# Patient Record
Sex: Female | Born: 1960 | Race: Black or African American | Hispanic: No | Marital: Single | Smoking: Never smoker
Health system: Southern US, Community
[De-identification: ages and names within clinical notes are randomized; demographics above are authoritative.]

## PROBLEM LIST (undated history)

## (undated) DIAGNOSIS — Z8679 Personal history of other diseases of the circulatory system: Secondary | ICD-10-CM

## (undated) DIAGNOSIS — I10 Essential (primary) hypertension: Secondary | ICD-10-CM

## (undated) DIAGNOSIS — K642 Third degree hemorrhoids: Secondary | ICD-10-CM

## (undated) DIAGNOSIS — K219 Gastro-esophageal reflux disease without esophagitis: Secondary | ICD-10-CM

## (undated) DIAGNOSIS — Z973 Presence of spectacles and contact lenses: Secondary | ICD-10-CM

---

## 2005-04-09 HISTORY — PX: TOTAL ABDOMINAL HYSTERECTOMY W/ BILATERAL SALPINGOOPHORECTOMY: SHX83

## 2015-12-27 ENCOUNTER — Ambulatory Visit: Payer: Self-pay | Admitting: Surgery

## 2015-12-27 NOTE — H&P (Signed)
Christine Jones 12/27/2015 11:05 AM Location: [facility] Patient #: [id number] DOB: 1960-05-19 Divorced / Language: [place] / Race: Refused to Report/Unreported Female   History of Present Illness Christine Sportsman MD; 12/27/2015 12:13 PM) The patient is a 55 year old female who presents with hemorrhoids. Note for "Hemorrhoids": Woman referred my partner Dr. Derrell Lolling for concern of persistent hemorrhoid issues.  55 year old woman with chronic hemorrhoid issues. Seen by our group 3 months ago. She's had a couple bandings done. Was started on Miralax. There is discussion about considering surgery. Has some external hemorrhoids that are concerning. Both agreed she was exhausting nonsurgical options. Therefore surgery recommended. However think the patient changed her mind given costs of surgery. She wished to get a second opinion to see if that her options before proceeding with surgery. Therefore sent to me for evaluation.  No personal nor family history of GI/colon cancer, inflammatory bowel disease, irritable bowel syndrome, allergy such as Celiac Sprue, dietary/dairy problems, colitis, ulcers nor gastritis. No recent sick contacts/gastroenteritis. No travel outside the country. No changes in diet. No dysphagia to solids or liquids. No significant heartburn or reflux. No hematochezia, hematemesis, coffee ground emesis. No evidence of prior gastric/peptic ulceration. She apparently had a colonoscopy urinating nearby [facility] gastroenterology. She recalls being told it was completely normal aside from the hemorrhoids.   Allergies (Sonya Bynum, CMA; 12/27/2015 11:06 AM) No Known Drug Allergies06/22/2017  Medication History (Sonya Bynum, CMA; 12/27/2015 11:07 AM) Anusol-HC (25MG  Suppository, 1 (one) suppository Rectal two times daily, Taken starting 10/24/2015) Active. Telmisartan-Amlodipine (40-5MG  Tablet, Oral) Active. Medications Reconciled  Vitals (Sonya Bynum CMA;  12/27/2015 11:06 AM) 12/27/2015 11:06 AM Weight: 240 lb Height: 66in Body Surface Area: 2.16 m Body Mass Index: 38.74 kg/m  Temp.: 69F(Temporal)  Pulse: 77 (Regular)  BP: 130/76 (Sitting, Left Arm, Standard)       Physical Exam Christine Sportsman MD; 12/27/2015 12:12 PM) General Mental Status-Alert. General Appearance-Not in acute distress, Not Sickly. Orientation-Oriented X3. Hydration-Well hydrated. Voice-Normal.  Integumentary Global Assessment Upon inspection and palpation of skin surfaces of the - Axillae: non-tender, no inflammation or ulceration, no drainage. and Distribution of scalp and body hair is normal. General Characteristics Temperature - normal warmth is noted.  Head and Neck Head-normocephalic, atraumatic with no lesions or palpable masses. Face Global Assessment - atraumatic, no absence of expression. Neck Global Assessment - no abnormal movements, no bruit auscultated on the right, no bruit auscultated on the left, no decreased range of motion, non-tender. Trachea-midline. Thyroid Gland Characteristics - non-tender.  Eye Eyeball - Left-Extraocular movements intact, No Nystagmus. Eyeball - Right-Extraocular movements intact, No Nystagmus. Cornea - Left-No Hazy. Cornea - Right-No Hazy. Sclera/Conjunctiva - Left-No scleral icterus, No Discharge. Sclera/Conjunctiva - Right-No scleral icterus, No Discharge. Pupil - Left-Direct reaction to light normal. Pupil - Right-Direct reaction to light normal.  ENMT Ears Pinna - Left - no drainage observed, no generalized tenderness observed. Right - no drainage observed, no generalized tenderness observed. Nose and Sinuses External Inspection of the Nose - no destructive lesion observed. Inspection of the nares - Left - quiet respiration. Right - quiet respiration. Mouth and Throat Lips - Upper Lip - no fissures observed, no pallor noted. Lower Lip - no fissures observed,  no pallor noted. Nasopharynx - no discharge present. Oral Cavity/Oropharynx - Tongue - no dryness observed. Oral Mucosa - no cyanosis observed. Hypopharynx - no evidence of airway distress observed.  Chest and Lung Exam Inspection Movements - Normal and Symmetrical. Accessory muscles - No  use of accessory muscles in breathing. Palpation Palpation of the chest reveals - Non-tender. Auscultation Breath sounds - Normal and Clear.  Cardiovascular Auscultation Rhythm - Regular. Murmurs & Other Heart Sounds - Auscultation of the heart reveals - No Murmurs and No Systolic Clicks.  Abdomen Inspection Inspection of the abdomen reveals - No Visible peristalsis and No Abnormal pulsations. Umbilicus - No Bleeding, No Urine drainage. Palpation/Percussion Palpation and Percussion of the abdomen reveal - Soft, Non Tender, No Rebound tenderness, No Rigidity (guarding) and No Cutaneous hyperesthesia. Note: Abdomen soft. Nontender, nondistended. No guarding. No umbilical no other hernias   Female Genitourinary Sexual Maturity Tanner 5 - Adult hair pattern. Note: No vaginal bleeding nor discharge. No inguinal hernias.   Rectal Note: 3 piles internal hemorrhoids with partial prolapse and some external tag component as well.  Exam done with assistance of female PA-S in the room. Perianal skin clean with good hygiene. No pruritis ani. No pilonidal disease. No fissure. No abscess/fistula. Upper range of normal sphincter tone. No stricture. Very sensitive in anal canal on digital exam but no fissure felt. No warts. No obvious abscess.. Held off on anoscopy.   Peripheral Vascular Upper Extremity Inspection - Left - No Cyanotic nailbeds, Not Ischemic. Right - No Cyanotic nailbeds, Not Ischemic.  Neurologic Neurologic evaluation reveals -normal attention span and ability to concentrate, able to name objects and repeat phrases. Appropriate fund of knowledge , normal sensation and normal  coordination. Mental Status Affect - not angry, not paranoid. Cranial Nerves-Normal Bilaterally. Gait-Normal.  Neuropsychiatric Mental status exam performed with findings of-able to articulate well with normal speech/language, rate, volume and coherence, thought content normal with ability to perform basic computations and apply abstract reasoning and no evidence of hallucinations, delusions, obsessions or homicidal/suicidal ideation.  Musculoskeletal Global Assessment Spine, Ribs and Pelvis - no instability, subluxation or laxity. Right Upper Extremity - no instability, subluxation or laxity.  Lymphatic Head & Neck  General Head & Neck Lymphatics: Bilateral - Description - No Localized lymphadenopathy. Axillary  General Axillary Region: Bilateral - Description - No Localized lymphadenopathy. Femoral & Inguinal  Generalized Femoral & Inguinal Lymphatics: Left - Description - No Localized lymphadenopathy. Right - Description - No Localized lymphadenopathy.    Assessment & Plan Christine Jones(Dario Yono C. Chue Berkovich MD; 12/27/2015 12:14 PM) PROLAPSED INTERNAL HEMORRHOIDS, GRADE 3 (K64.2) Impression: 3 piles of internal hemorrhoids that prolapse rather easily with some external component.  Despite a fiber bowel regimen with alternating MiraLAX and Colace, she still struggles. She's had 2 banding stone and she still struggles.  I think this requires an operation. [facility]able [facility] and [facility] and then excise any persistent external tissue. However there are no other really good options. She is ready proceed.  I'll see if we can give her some better pain control regimens. I would like to avoid narcotics. Avoid per rectal suppositories and just do outside topical therapy. Add naproxen for an anti-inflammatory.  The anatomy & physiology of the anorectal region was discussed. The pathophysiology of hemorrhoids and differential diagnosis was discussed.  Natural history progression was discussed. I stressed the importance of a bowel regimen to have daily soft bowel movements to minimize progression of disease. Goal of one BM / day ideal. Use of wet wipes, warm baths, avoiding straining, etc were emphasized.  Educational handouts further explaining the pathology, treatment options, and bowel regimen were given as well. The patient expressed understanding. Current Plans Pt Education - [facility] Hemorrhoids (Yatziry Deakins): discussed  with patient and provided information. You are being scheduled for surgery - Our schedulers will call you.  You should hear from our office's scheduling department within 5 working days about the location, date, and time of surgery. We try to make accommodations for patient's preferences in scheduling surgery, but sometimes the OR schedule or the surgeon's schedule prevents [place] from making those accommodations.  If you have not heard from our office [phone]) in 5 working days, call the office and ask for your surgeon's nurse.  If you have other questions about your diagnosis, plan, or surgery, call the office and ask for your surgeon's nurse.  The anatomy & physiology of the anorectal region was discussed. The pathophysiology of hemorrhoids and differential diagnosis was discussed. Natural history risks without surgery was discussed. I stressed the importance of a bowel regimen to have daily soft bowel movements to minimize progression of disease. Interventions such as sclerotherapy & banding were discussed.  The patient's symptoms are not adequately controlled by medicines and other non-operative treatments. I feel the risks & problems of no surgery outweigh the operative risks; therefore, I recommended surgery to treat the hemorrhoids by ligation, pexy, and possible resection.  Risks such as bleeding, infection, urinary difficulties, need for further treatment, heart attack, death, and other risks were discussed. I noted a good  likelihood this will help address the problem. Goals of post-operative recovery were discussed as well. Possibility that this will not correct all symptoms was explained. Post-operative pain, bleeding, constipation, and other problems after surgery were discussed. We will work to minimize complications. Educational handouts further explaining the pathology, treatment options, and bowel regimen were given as well. Questions were answered. The patient expresses understanding & wishes to proceed with surgery.  Pt Education - [facility] Rectal Prep for Anorectal outpatient/office surgery: discussed with patient and provided information. Pt Education - [facility] Rectal Surgery HCI (Nalaya Wojdyla): discussed with patient and provided information. Started Naproxen 500MG , 1 (one) Tablet two times daily, #30, 12/27/2015, Ref. x1. Started Anusol-HC 2.5%, 1 (one) Application three times daily, as needed, 1 Tube, 12/27/2015, Ref. x3. EXTERNAL HEMORRHOIDS WITH COMPLICATION (K64.4)    Signed by Christine Sportsman, MD (12/27/2015 12:15 PM)

## 2016-02-13 ENCOUNTER — Encounter (HOSPITAL_BASED_OUTPATIENT_CLINIC_OR_DEPARTMENT_OTHER): Payer: Self-pay | Admitting: *Deleted

## 2016-02-13 NOTE — Progress Notes (Signed)
NPO AFTER MN.  ARRIVE AT 0900.  NEEDS ISTAT AND EKG.  WILL TAKE ZANTAC AM DOS W/ SIPS OF WATER.  PT VERBALIZED WAS GIVEN RECTAL PREP INSTRUCTIONS AND UNDERSTOOD THEM.  WILL DO HIBICLENS HS BEFORE AND AM DOS.

## 2016-02-16 ENCOUNTER — Encounter (HOSPITAL_BASED_OUTPATIENT_CLINIC_OR_DEPARTMENT_OTHER)
Admission: RE | Disposition: A | Discharge status: Home / Self Care | Payer: Self-pay | Source: Ambulatory Visit | Attending: Surgery

## 2016-02-16 ENCOUNTER — Ambulatory Visit (HOSPITAL_BASED_OUTPATIENT_CLINIC_OR_DEPARTMENT_OTHER)
Admission: RE | Admit: 2016-02-16 | Discharge: 2016-02-16 | Disposition: A | Discharge status: Home / Self Care | Payer: BLUE CROSS/BLUE SHIELD | Source: Ambulatory Visit | Attending: Surgery | Admitting: Surgery

## 2016-02-16 ENCOUNTER — Encounter (HOSPITAL_BASED_OUTPATIENT_CLINIC_OR_DEPARTMENT_OTHER): Payer: Self-pay

## 2016-02-16 ENCOUNTER — Ambulatory Visit (HOSPITAL_BASED_OUTPATIENT_CLINIC_OR_DEPARTMENT_OTHER): Payer: BLUE CROSS/BLUE SHIELD | Admitting: Anesthesiology

## 2016-02-16 ENCOUNTER — Other Ambulatory Visit: Payer: Self-pay

## 2016-02-16 DIAGNOSIS — Z6839 Body mass index (BMI) 39.0-39.9, adult: Secondary | ICD-10-CM | POA: Insufficient documentation

## 2016-02-16 DIAGNOSIS — K644 Residual hemorrhoidal skin tags: Secondary | ICD-10-CM | POA: Diagnosis not present

## 2016-02-16 DIAGNOSIS — I10 Essential (primary) hypertension: Secondary | ICD-10-CM | POA: Diagnosis not present

## 2016-02-16 DIAGNOSIS — K219 Gastro-esophageal reflux disease without esophagitis: Secondary | ICD-10-CM | POA: Diagnosis not present

## 2016-02-16 DIAGNOSIS — K643 Fourth degree hemorrhoids: Secondary | ICD-10-CM | POA: Diagnosis present

## 2016-02-16 DIAGNOSIS — Z79899 Other long term (current) drug therapy: Secondary | ICD-10-CM | POA: Diagnosis not present

## 2016-02-16 DIAGNOSIS — K642 Third degree hemorrhoids: Secondary | ICD-10-CM

## 2016-02-16 DIAGNOSIS — E669 Obesity, unspecified: Secondary | ICD-10-CM | POA: Diagnosis not present

## 2016-02-16 HISTORY — DX: Third degree hemorrhoids: K64.2

## 2016-02-16 HISTORY — DX: Essential (primary) hypertension: I10

## 2016-02-16 HISTORY — PX: HEMORRHOID SURGERY: SHX153

## 2016-02-16 HISTORY — DX: Personal history of other diseases of the circulatory system: Z86.79

## 2016-02-16 HISTORY — DX: Gastro-esophageal reflux disease without esophagitis: K21.9

## 2016-02-16 HISTORY — DX: Presence of spectacles and contact lenses: Z97.3

## 2016-02-16 LAB — POCT I-STAT, CHEM 8
BUN: 10 mg/dL (ref 6–20)
CALCIUM ION: 1.25 mmol/L (ref 1.15–1.40)
CREATININE: 1.1 mg/dL — AB (ref 0.44–1.00)
Chloride: 105 mmol/L (ref 101–111)
GLUCOSE: 95 mg/dL (ref 65–99)
HEMATOCRIT: 40 % (ref 36.0–46.0)
HEMOGLOBIN: 13.6 g/dL (ref 12.0–15.0)
Potassium: 3.8 mmol/L (ref 3.5–5.1)
Sodium: 140 mmol/L (ref 135–145)
TCO2: 27 mmol/L (ref 0–100)

## 2016-02-16 SURGERY — EXAM UNDER ANESTHESIA
Anesthesia: General

## 2016-02-16 MED ORDER — EPINEPHRINE PF 1 MG/ML IJ SOLN
INTRAMUSCULAR | Status: AC
  Filled 2016-02-16: qty 1

## 2016-02-16 MED ORDER — METRONIDAZOLE IN NACL 5-0.79 MG/ML-% IV SOLN
500.0000 mg | INTRAVENOUS | Status: AC
  Administered 2016-02-16: 500 mg via INTRAVENOUS
  Filled 2016-02-16: qty 100

## 2016-02-16 MED ORDER — GABAPENTIN 300 MG PO CAPS
300.0000 mg | ORAL_CAPSULE | ORAL | Status: AC
  Administered 2016-02-16: 300 mg via ORAL
  Filled 2016-02-16: qty 1

## 2016-02-16 MED ORDER — LIDOCAINE 2% (20 MG/ML) 5 ML SYRINGE
INTRAMUSCULAR | Status: AC
  Filled 2016-02-16: qty 10

## 2016-02-16 MED ORDER — SUCCINYLCHOLINE CHLORIDE 200 MG/10ML IV SOSY
PREFILLED_SYRINGE | INTRAVENOUS | Status: DC | PRN
  Administered 2016-02-16: 100 mg via INTRAVENOUS

## 2016-02-16 MED ORDER — CELECOXIB 200 MG PO CAPS
ORAL_CAPSULE | ORAL | Status: AC
  Filled 2016-02-16: qty 1

## 2016-02-16 MED ORDER — BUPIVACAINE HCL (PF) 0.25 % IJ SOLN
INTRAMUSCULAR | Status: AC
  Filled 2016-02-16: qty 30

## 2016-02-16 MED ORDER — BUPIVACAINE LIPOSOME 1.3 % IJ SUSP
INTRAMUSCULAR | Status: AC
  Filled 2016-02-16: qty 20

## 2016-02-16 MED ORDER — CHLORHEXIDINE GLUCONATE CLOTH 2 % EX PADS
6.0000 | MEDICATED_PAD | Freq: Once | CUTANEOUS | Status: DC
  Filled 2016-02-16: qty 6

## 2016-02-16 MED ORDER — FENTANYL CITRATE (PF) 100 MCG/2ML IJ SOLN
25.0000 ug | INTRAMUSCULAR | Status: DC | PRN
  Filled 2016-02-16: qty 1

## 2016-02-16 MED ORDER — ONDANSETRON HCL 4 MG/2ML IJ SOLN
INTRAMUSCULAR | Status: DC | PRN
  Administered 2016-02-16: 4 mg via INTRAVENOUS

## 2016-02-16 MED ORDER — BUPIVACAINE-EPINEPHRINE 0.25% -1:200000 IJ SOLN
INTRAMUSCULAR | Status: DC | PRN
  Administered 2016-02-16: 30 mL

## 2016-02-16 MED ORDER — DEXAMETHASONE SODIUM PHOSPHATE 4 MG/ML IJ SOLN
INTRAMUSCULAR | Status: DC | PRN
  Administered 2016-02-16: 10 mg via INTRAVENOUS

## 2016-02-16 MED ORDER — LACTATED RINGERS IV SOLN
INTRAVENOUS | Status: DC
  Administered 2016-02-16 (×2): via INTRAVENOUS
  Filled 2016-02-16: qty 1000

## 2016-02-16 MED ORDER — DIBUCAINE 1 % RE OINT
TOPICAL_OINTMENT | RECTAL | Status: DC | PRN
  Administered 2016-02-16: 1 via RECTAL

## 2016-02-16 MED ORDER — SUCCINYLCHOLINE CHLORIDE 20 MG/ML IJ SOLN: INTRAMUSCULAR | Status: DC | PRN

## 2016-02-16 MED ORDER — OXYCODONE HCL 5 MG PO TABS
5.0000 mg | ORAL_TABLET | Freq: Once | ORAL | Status: AC
  Administered 2016-02-16: 5 mg via ORAL
  Filled 2016-02-16: qty 1

## 2016-02-16 MED ORDER — EPHEDRINE SULFATE 50 MG/ML IJ SOLN
INTRAMUSCULAR | Status: DC | PRN
  Administered 2016-02-16: 5 mg via INTRAVENOUS
  Administered 2016-02-16: 10 mg via INTRAVENOUS

## 2016-02-16 MED ORDER — GABAPENTIN 300 MG PO CAPS
ORAL_CAPSULE | ORAL | Status: AC
  Filled 2016-02-16: qty 1

## 2016-02-16 MED ORDER — LIDOCAINE HCL (CARDIAC) 20 MG/ML IV SOLN
INTRAVENOUS | Status: DC | PRN
  Administered 2016-02-16: 40 mg via INTRAVENOUS

## 2016-02-16 MED ORDER — FENTANYL CITRATE (PF) 100 MCG/2ML IJ SOLN
INTRAMUSCULAR | Status: DC | PRN
  Administered 2016-02-16: 100 ug via INTRAVENOUS

## 2016-02-16 MED ORDER — CEFAZOLIN SODIUM-DEXTROSE 2-4 GM/100ML-% IV SOLN
INTRAVENOUS | Status: AC
  Filled 2016-02-16: qty 100

## 2016-02-16 MED ORDER — METRONIDAZOLE IN NACL 5-0.79 MG/ML-% IV SOLN
INTRAVENOUS | Status: AC
  Filled 2016-02-16: qty 100

## 2016-02-16 MED ORDER — PROMETHAZINE HCL 25 MG/ML IJ SOLN
6.2500 mg | INTRAMUSCULAR | Status: DC | PRN
  Filled 2016-02-16: qty 1

## 2016-02-16 MED ORDER — FENTANYL CITRATE (PF) 100 MCG/2ML IJ SOLN
INTRAMUSCULAR | Status: AC
  Filled 2016-02-16: qty 2

## 2016-02-16 MED ORDER — MIDAZOLAM HCL 2 MG/2ML IJ SOLN
INTRAMUSCULAR | Status: AC
  Filled 2016-02-16: qty 2

## 2016-02-16 MED ORDER — ARTIFICIAL TEARS OP OINT
TOPICAL_OINTMENT | OPHTHALMIC | Status: AC
  Filled 2016-02-16: qty 3.5

## 2016-02-16 MED ORDER — OXYCODONE HCL 5 MG PO TABS: 5.0000 mg | ORAL_TABLET | ORAL | 0 refills | Status: AC | PRN

## 2016-02-16 MED ORDER — CELECOXIB 400 MG PO CAPS
400.0000 mg | ORAL_CAPSULE | ORAL | Status: AC
  Administered 2016-02-16: 400 mg via ORAL
  Filled 2016-02-16: qty 1

## 2016-02-16 MED ORDER — CEFAZOLIN SODIUM-DEXTROSE 2-4 GM/100ML-% IV SOLN
2.0000 g | INTRAVENOUS | Status: AC
  Administered 2016-02-16: 2 g via INTRAVENOUS
  Filled 2016-02-16: qty 100

## 2016-02-16 MED ORDER — NAPROXEN 500 MG PO TABS: 500.0000 mg | ORAL_TABLET | Freq: Two times a day (BID) | ORAL | 1 refills | Status: AC

## 2016-02-16 MED ORDER — LIDOCAINE 2% (20 MG/ML) 5 ML SYRINGE
INTRAMUSCULAR | Status: DC | PRN
  Administered 2016-02-16: 50 mg via INTRAVENOUS

## 2016-02-16 MED ORDER — BUPIVACAINE LIPOSOME 1.3 % IJ SUSP
20.0000 mL | INTRAMUSCULAR | Status: DC
  Filled 2016-02-16: qty 20

## 2016-02-16 MED ORDER — ACETAMINOPHEN 500 MG PO TABS
ORAL_TABLET | ORAL | Status: AC
  Filled 2016-02-16: qty 2

## 2016-02-16 MED ORDER — PROPOFOL 10 MG/ML IV BOLUS
INTRAVENOUS | Status: DC | PRN
  Administered 2016-02-16: 150 mg via INTRAVENOUS

## 2016-02-16 MED ORDER — OXYCODONE HCL 5 MG PO TABS
ORAL_TABLET | ORAL | Status: AC
  Filled 2016-02-16: qty 1

## 2016-02-16 MED ORDER — ONDANSETRON HCL 4 MG/2ML IJ SOLN
INTRAMUSCULAR | Status: AC
  Filled 2016-02-16: qty 2

## 2016-02-16 MED ORDER — ACETAMINOPHEN 500 MG PO TABS
1000.0000 mg | ORAL_TABLET | ORAL | Status: AC
  Administered 2016-02-16: 1000 mg via ORAL
  Filled 2016-02-16: qty 2

## 2016-02-16 MED ORDER — CELECOXIB 200 MG PO CAPS
ORAL_CAPSULE | ORAL | Status: AC
  Filled 2016-02-16: qty 2

## 2016-02-16 MED ORDER — DIBUCAINE 1 % RE OINT
TOPICAL_OINTMENT | RECTAL | Status: AC
  Filled 2016-02-16: qty 28

## 2016-02-16 MED ORDER — PHENYLEPHRINE 40 MCG/ML (10ML) SYRINGE FOR IV PUSH (FOR BLOOD PRESSURE SUPPORT)
PREFILLED_SYRINGE | INTRAVENOUS | Status: AC
  Filled 2016-02-16: qty 10

## 2016-02-16 MED ORDER — BUPIVACAINE LIPOSOME 1.3 % IJ SUSP
INTRAMUSCULAR | Status: DC | PRN
Start: 2016-02-16 — End: 2016-02-16
  Administered 2016-02-16: 20 mL

## 2016-02-16 MED ORDER — SODIUM CHLORIDE 0.9 % IJ SOLN
INTRAMUSCULAR | Status: AC
  Filled 2016-02-16: qty 50

## 2016-02-16 MED FILL — NAPROXEN 500 MG TABLET: 500 | 15 days supply | Qty: 30 | Fill #0

## 2016-02-16 MED FILL — oxyCODONE HCL 5 MG TABS: 5 | 3 days supply | Qty: 40 | Fill #0

## 2016-02-16 SURGICAL SUPPLY — 60 items
BENZOIN TINCTURE PRP APPL 2/3 (GAUZE/BANDAGES/DRESSINGS) ×3 IMPLANT
BLADE CLIPPER SURG (BLADE) IMPLANT
BLADE HEX COATED 2.75 (ELECTRODE) IMPLANT
BLADE SURG 10 STRL SS (BLADE) IMPLANT
BLADE SURG 15 STRL LF DISP TIS (BLADE) ×1 IMPLANT
BLADE SURG 15 STRL SS (BLADE) ×2
BRIEF STRETCH FOR OB PAD LRG (UNDERPADS AND DIAPERS) ×3 IMPLANT
CANISTER SUCTION 1200CC (MISCELLANEOUS) ×3 IMPLANT
COVER BACK TABLE 60X90IN (DRAPES) ×3 IMPLANT
COVER MAYO STAND STRL (DRAPES) ×3 IMPLANT
DECANTER SPIKE VIAL GLASS SM (MISCELLANEOUS) IMPLANT
DRAPE LAPAROTOMY 100X72 PEDS (DRAPES) ×3 IMPLANT
DRAPE LG THREE QUARTER DISP (DRAPES) IMPLANT
DRSG PAD ABDOMINAL 8X10 ST (GAUZE/BANDAGES/DRESSINGS) IMPLANT
ELECT NEEDLE TIP 2.8 STRL (NEEDLE) IMPLANT
ELECT REM PT RETURN 9FT ADLT (ELECTROSURGICAL) ×3
ELECTRODE REM PT RTRN 9FT ADLT (ELECTROSURGICAL) ×1 IMPLANT
GLOVE BIOGEL PI IND STRL 8 (GLOVE) ×1 IMPLANT
GLOVE BIOGEL PI INDICATOR 8 (GLOVE) ×2
GLOVE ECLIPSE 8.0 STRL XLNG CF (GLOVE) ×3 IMPLANT
GLOVE INDICATOR 8.0 STRL GRN (GLOVE) ×3 IMPLANT
GOWN STRL REUS W/ TWL LRG LVL3 (GOWN DISPOSABLE) ×1 IMPLANT
GOWN STRL REUS W/ TWL XL LVL3 (GOWN DISPOSABLE) ×1 IMPLANT
GOWN STRL REUS W/TWL LRG LVL3 (GOWN DISPOSABLE) ×2
GOWN STRL REUS W/TWL XL LVL3 (GOWN DISPOSABLE) ×2
KIT ROOM TURNOVER WOR (KITS) ×3 IMPLANT
LEGGING LITHOTOMY PAIR STRL (DRAPES) IMPLANT
NDL SAFETY ECLIPSE 18X1.5 (NEEDLE) ×1 IMPLANT
NEEDLE HYPO 18GX1.5 SHARP (NEEDLE) ×2
NEEDLE HYPO 22GX1.5 SAFETY (NEEDLE) ×3 IMPLANT
NS IRRIG 500ML POUR BTL (IV SOLUTION) ×3 IMPLANT
PACK BASIN DAY SURGERY FS (CUSTOM PROCEDURE TRAY) ×3 IMPLANT
PAD ABD 8X10 STRL (GAUZE/BANDAGES/DRESSINGS) ×3 IMPLANT
PAD PREP 24X48 CUFFED NSTRL (MISCELLANEOUS) IMPLANT
PENCIL BUTTON HOLSTER BLD 10FT (ELECTRODE) ×3 IMPLANT
SHEARS HARMONIC 9CM CVD (BLADE) IMPLANT
SPONGE GAUZE 4X4 12PLY STER LF (GAUZE/BANDAGES/DRESSINGS) ×3 IMPLANT
SPONGE SURGIFOAM ABS GEL 100 (HEMOSTASIS) IMPLANT
SPONGE SURGIFOAM ABS GEL 12-7 (HEMOSTASIS) IMPLANT
SURGILUBE 2OZ TUBE FLIPTOP (MISCELLANEOUS) ×3 IMPLANT
SUT CHROMIC 2 0 SH (SUTURE) IMPLANT
SUT CHROMIC 3 0 SH 27 (SUTURE) ×3 IMPLANT
SUT VIC AB 2-0 SH 27 (SUTURE)
SUT VIC AB 2-0 SH 27X BRD (SUTURE) IMPLANT
SUT VIC AB 2-0 SH 27XBRD (SUTURE) IMPLANT
SUT VIC AB 2-0 UR6 27 (SUTURE) IMPLANT
SUT VICRYL 0 UR6 27IN ABS (SUTURE) ×18 IMPLANT
SUT VICRYL AB 2 0 TIE (SUTURE) IMPLANT
SUT VICRYL AB 2 0 TIES (SUTURE)
SYR 20CC LL (SYRINGE) ×3 IMPLANT
SYR BULB IRRIGATION 50ML (SYRINGE) ×3 IMPLANT
TAPE CLOTH 2X10 TAN LF (GAUZE/BANDAGES/DRESSINGS) IMPLANT
TAPE HYPAFIX 4 X10 (GAUZE/BANDAGES/DRESSINGS) IMPLANT
TOWEL OR 17X24 6PK STRL BLUE (TOWEL DISPOSABLE) ×6 IMPLANT
TRAY DSU PREP LF (CUSTOM PROCEDURE TRAY) ×3 IMPLANT
TUBE CONNECTING 12'X1/4 (SUCTIONS) ×1
TUBE CONNECTING 12X1/4 (SUCTIONS) ×2 IMPLANT
UNDERPAD 30X30 INCONTINENT (UNDERPADS AND DIAPERS) ×3 IMPLANT
WATER STERILE IRR 500ML POUR (IV SOLUTION) ×3 IMPLANT
YANKAUER SUCT BULB TIP NO VENT (SUCTIONS) ×3 IMPLANT

## 2016-02-16 NOTE — H&P (Signed)
Christine Jones  Location: [facility] Patient #: [id number] DOB: 30-Jan-1961 Divorced / Language: [place] / Race: Refused to Report/Unreported Female  Patient Care Team: Renaye Rakers, MD as PCP - General (Family Medicine) Karie Soda, MD as Consulting Physician (General Surgery)   History of Present Illness    Woman referred my partner Dr. Derrell Lolling for concern of persistent hemorrhoid issues.  55 year old woman with chronic hemorrhoid issues. Seen by our group 3 months ago. She's had a couple bandings done. Was started on Miralax. There is discussion about considering surgery. Has some external hemorrhoids that are concerning. Both agreed she was exhausting nonsurgical options. Therefore surgery recommended. However think the patient changed her mind given costs of surgery. She wished to get a second opinion to see if that her options before proceeding with surgery. Therefore sent to me for evaluation.  No personal nor family history of GI/colon cancer, inflammatory bowel disease, irritable bowel syndrome, allergy such as Celiac Sprue, dietary/dairy problems, colitis, ulcers nor gastritis. No recent sick contacts/gastroenteritis. No travel outside the country. No changes in diet. No dysphagia to solids or liquids. No significant heartburn or reflux. No hematochezia, hematemesis, coffee ground emesis. No evidence of prior gastric/peptic ulceration. She apparently had a colonoscopy urinating nearby [facility] gastroenterology. She recalls being told it was completely normal aside from the hemorrhoids.  No new events  Allergies (Christine Jones, CMA; 12/27/2015 11:06 AM) No Known Drug Allergies06/22/2017  Medication History (Christine Jones, CMA; 12/27/2015 11:07 AM) Anusol-HC (25MG  Suppository, 1 (one) suppository Rectal two times daily, Taken starting 10/24/2015) Active. Telmisartan-Amlodipine (40-5MG  Tablet, Oral) Active. Medications Reconciled  Vitals (Christine Jones CMA;  12/27/2015 11:06 AM) 12/27/2015 11:06 AM Weight: 240 lb Height: 66in Body Surface Area: 2.16 m Body Mass Index: 38.74 kg/m  Temp.: 61F(Temporal)  Pulse: 77 (Regular)  BP: 130/76 (Sitting, Left Arm, Standard)       Physical Exam Ardeth Sportsman MD; 12/27/2015 12:12 PM) General Mental Status-Alert. General Appearance-Not in acute distress, Not Sickly. Orientation-Oriented X3. Hydration-Well hydrated. Voice-Normal.  Integumentary Global Assessment Upon inspection and palpation of skin surfaces of the - Axillae: non-tender, no inflammation or ulceration, no drainage. and Distribution of scalp and body hair is normal. General Characteristics Temperature - normal warmth is noted.  Head and Neck Head-normocephalic, atraumatic with no lesions or palpable masses. Face Global Assessment - atraumatic, no absence of expression. Neck Global Assessment - no abnormal movements, no bruit auscultated on the right, no bruit auscultated on the left, no decreased range of motion, non-tender. Trachea-midline. Thyroid Gland Characteristics - non-tender.  Eye Eyeball - Left-Extraocular movements intact, No Nystagmus. Eyeball - Right-Extraocular movements intact, No Nystagmus. Cornea - Left-No Hazy. Cornea - Right-No Hazy. Sclera/Conjunctiva - Left-No scleral icterus, No Discharge. Sclera/Conjunctiva - Right-No scleral icterus, No Discharge. Pupil - Left-Direct reaction to light normal. Pupil - Right-Direct reaction to light normal.  ENMT Ears Pinna - Left - no drainage observed, no generalized tenderness observed. Right - no drainage observed, no generalized tenderness observed. Nose and Sinuses External Inspection of the Nose - no destructive lesion observed. Inspection of the nares - Left - quiet respiration. Right - quiet respiration. Mouth and Throat Lips - Upper Lip - no fissures observed, no pallor noted. Lower Lip - no fissures observed,  no pallor noted. Nasopharynx - no discharge present. Oral Cavity/Oropharynx - Tongue - no dryness observed. Oral Mucosa - no cyanosis observed. Hypopharynx - no evidence of airway distress observed.  Chest and Lung Exam Inspection Movements - Normal and Symmetrical. Accessory  muscles - No use of accessory muscles in breathing. Palpation Palpation of the chest reveals - Non-tender. Auscultation Breath sounds - Normal and Clear.  Cardiovascular Auscultation Rhythm - Regular. Murmurs & Other Heart Sounds - Auscultation of the heart reveals - No Murmurs and No Systolic Clicks.  Abdomen Inspection Inspection of the abdomen reveals - No Visible peristalsis and No Abnormal pulsations. Umbilicus - No Bleeding, No Urine drainage. Palpation/Percussion Palpation and Percussion of the abdomen reveal - Soft, Non Tender, No Rebound tenderness, No Rigidity (guarding) and No Cutaneous hyperesthesia. Note: Abdomen soft. Nontender, nondistended. No guarding. No umbilical no other hernias   Female Genitourinary Sexual Maturity Tanner 5 - Adult hair pattern. Note: No vaginal bleeding nor discharge. No inguinal hernias.   Rectal Note: 3 piles internal hemorrhoids with partial prolapse and some external tag component as well.  Exam done with assistance of female PA-S in the room. Perianal skin clean with good hygiene. No pruritis ani. No pilonidal disease. No fissure. No abscess/fistula. Upper range of normal sphincter tone. No stricture. Very sensitive in anal canal on digital exam but no fissure felt. No warts. No obvious abscess.. Held off on anoscopy.   Peripheral Vascular Upper Extremity Inspection - Left - No Cyanotic nailbeds, Not Ischemic. Right - No Cyanotic nailbeds, Not Ischemic.  Neurologic Neurologic evaluation reveals -normal attention span and ability to concentrate, able to name objects and repeat phrases. Appropriate fund of knowledge , normal sensation and normal  coordination. Mental Status Affect - not angry, not paranoid. Cranial Nerves-Normal Bilaterally. Gait-Normal.  Neuropsychiatric Mental status exam performed with findings of-able to articulate well with normal speech/language, rate, volume and coherence, thought content normal with ability to perform basic computations and apply abstract reasoning and no evidence of hallucinations, delusions, obsessions or homicidal/suicidal ideation.  Musculoskeletal Global Assessment Spine, Ribs and Pelvis - no instability, subluxation or laxity. Right Upper Extremity - no instability, subluxation or laxity.  Lymphatic Head & Neck  General Head & Neck Lymphatics: Bilateral - Description - No Localized lymphadenopathy. Axillary  General Axillary Region: Bilateral - Description - No Localized lymphadenopathy. Femoral & Inguinal  Generalized Femoral & Inguinal Lymphatics: Left - Description - No Localized lymphadenopathy. Right - Description - No Localized lymphadenopathy.    Assessment & Plan PROLAPSED INTERNAL HEMORRHOIDS, GRADE 3 (K64.2) Impression: 3 piles of internal hemorrhoids that prolapse rather easily with some external component.  Despite a fiber bowel regimen with alternating MiraLAX and Colace, she still struggles. She's had 2 banding stone and she still struggles.  I think this requires an operation. Reasonable to try and do internal hemorrhoidal ligation in the [facility]HD like fashion and then excise any persistent external tissue. However there are no other really good options. She is ready proceed.  I'll see if we can give her some better pain control regimens. I would like to avoid narcotics. Avoid per rectal suppositories and just do outside topical therapy. Add naproxen for an anti-inflammatory.  The anatomy & physiology of the anorectal region was discussed. The pathophysiology of hemorrhoids and differential diagnosis was discussed. Natural history progression was discussed. I  stressed the importance of a bowel regimen to have daily soft bowel movements to minimize progression of disease. Goal of one BM / day ideal. Use of wet wipes, warm baths, avoiding straining, etc were emphasized.  Educational handouts further explaining the pathology, treatment options, and bowel regimen were given as well. The patient expressed understanding.  I have re-reviewed the the patient's records, history, medications, and allergies.  I have re-examined the patient.  I again discussed intraoperative plans and goals of post-operative recovery.  The patient agrees to proceed.   Current Plans Pt Education - [facility] Hemorrhoids (Laquasia Pincus): discussed with patient and provided information. You are being scheduled for surgery - Our schedulers will call you.  You should hear from our office's scheduling department within 5 working days about the location, date, and time of surgery. We try to make accommodations for patient's preferences in scheduling surgery, but sometimes the OR schedule or the surgeon's schedule prevents us from making those accommodations.  If you have not heard from our office [phone]) in 5 working days, call the office and ask for your surgeon's nurse.  If you have other questions about your diagnosis, plan, or surgery, call the office and ask for your surgeon's nurse.  The anatomy & physiology of the anorectal region was discussed. The pathophysiology of hemorrhoids and differential diagnosis was discussed. Natural history risks without surgery was discussed. I stressed the importance of a bowel regimen to have daily soft bowel movements to minimize progression of disease. Interventions such as sclerotherapy & banding were discussed.  The patient's symptoms are not adequately controlled by medicines and other non-operative treatments. I feel the risks & problems of no surgery outweigh the operative risks; therefore, I recommended surgery to treat the hemorrhoids by ligation,  pexy, and possible resection.  Risks such as bleeding, infection, urinary difficulties, need for further treatment, heart attack, death, and other risks were discussed. I noted a good likelihood this will help address the problem. Goals of post-operative recovery were discussed as well. Possibility that this will not correct all symptoms was explained. Post-operative pain, bleeding, constipation, and other problems after surgery were discussed. We will work to minimize complications. Educational handouts further explaining the pathology, treatment options, and bowel regimen were given as well. Questions were answered. The patient expresses understanding & wishes to proceed with surgery.  Pt Education - [facility] Rectal Prep for Anorectal outpatient/office surgery: discussed with patient and provided information. Pt Education - [facility] Rectal Surgery HCI (Nikeshia Keetch): discussed with patient and provided information. Started Naproxen 500MG , 1 (one) Tablet two times daily, #30, 12/27/2015, Ref. x1. Started Anusol-HC 2.5%, 1 (one) Application three times daily, as needed, 1 Tube, 12/27/2015, Ref. x3. EXTERNAL HEMORRHOIDS WITH COMPLICATION (K64.4)  Ardeth SportsmanSteven C. Gaspare Netzel, M.D., F.A.C.S. Gastrointestinal and Minimally Invasive Surgery [facility] [address] [place] [id number] [phone] Main / Paging

## 2016-02-16 NOTE — Anesthesia Preprocedure Evaluation (Addendum)
Anesthesia Evaluation  Patient identified by MRN, date of birth, ID band Patient awake    Reviewed: Allergy & Precautions, NPO status , Patient's Chart, lab work & pertinent test results  Airway Mallampati: I  TM Distance: >3 FB Neck ROM: Full    Dental  (+) Teeth Intact, Dental Advisory Given   Pulmonary neg pulmonary ROS,    Pulmonary exam normal breath sounds clear to auscultation       Cardiovascular hypertension, Pt. on medications (-) angina(-) CAD, (-) Past MI and (-) CHF Normal cardiovascular exam Rhythm:Regular Rate:Normal     Neuro/Psych negative neurological ROS  negative psych ROS   GI/Hepatic Neg liver ROS, GERD  Medicated and Controlled,  Endo/Other  Obesity   Renal/GU negative Renal ROS     Musculoskeletal negative musculoskeletal ROS (+)   Abdominal   Peds  Hematology negative hematology ROS (+)   Anesthesia Other Findings Day of surgery medications reviewed with the patient.  Reproductive/Obstetrics                            Anesthesia Physical Anesthesia Plan  ASA: II  Anesthesia Plan: General   Post-op Pain Management:    Induction: Intravenous  Airway Management Planned: Oral ETT  Additional Equipment:   Intra-op Plan:   Post-operative Plan: Extubation in OR  Informed Consent: I have reviewed the patients History and Physical, chart, labs and discussed the procedure including the risks, benefits and alternatives for the proposed anesthesia with the patient or authorized representative who has indicated his/her understanding and acceptance.   Dental advisory given  Plan Discussed with: CRNA  Anesthesia Plan Comments: (Risks/benefits of general anesthesia discussed with patient including risk of damage to teeth, lips, gum, and tongue, nausea/vomiting, allergic reactions to medications, and the possibility of heart attack, stroke and death.  All patient  questions answered.  Patient wishes to proceed.)        Anesthesia Quick Evaluation

## 2016-02-16 NOTE — Interval H&P Note (Signed)
History and Physical Interval Note:  02/16/2016 9:42 AM  Christine Jones  has presented today for surgery, with the diagnosis of GRADE 3 HEMMORRHOIDS, EXTERNAL HEMS  The various methods of treatment have been discussed with the patient and family. After consideration of risks, benefits and other options for treatment, the patient has consented to  Procedure(s): EXAM UNDER ANESTHESIA WITH HEMORRHODIAL LIGATION/PEXY (N/A) POSSIBLE HEMMORRHOIDECTOMY (N/A) as a surgical intervention .  The patient's history has been reviewed, patient examined, no change in status, stable for surgery.  I have reviewed the patient's chart and labs.  Questions were answered to the patient's satisfaction.     [name] C.

## 2016-02-16 NOTE — Transfer of Care (Signed)
Immediate Anesthesia Transfer of Care Note  Patient: Christine Jones  Procedure(s) Performed: Procedure(s): EXAM UNDER ANESTHESIA WITH HEMORRHODIAL LIGATION/PEXY (N/A) HEMMORRHOIDECTOMY (N/A)  Patient Location: PACU  Anesthesia Type:General  Level of Consciousness: awake and oriented  Airway & Oxygen Therapy: Patient Spontanous Breathing and Patient connected to nasal cannula oxygen  Post-op Assessment: Report given to RN  Post vital signs: Reviewed and stable  Last Vitals: 99/70, 80,20,100% Vitals:   02/16/16 0902  BP: 116/72  Pulse: 70  Resp: 16  Temp: 36.9 C    Last Pain:  Vitals:   02/16/16 0902  TempSrc: Oral      Patients Stated Pain Goal: 6 (02/16/16 0925)  Complications: No apparent anesthesia complications

## 2016-02-16 NOTE — Anesthesia Postprocedure Evaluation (Signed)
Anesthesia Post Note  Patient: [name]  Procedure(s) Performed: Procedure(s) (LRB): EXAM UNDER ANESTHESIA WITH HEMORRHODIAL LIGATION/PEXY (N/A) HEMMORRHOIDECTOMY (N/A)  Patient location during evaluation: PACU Anesthesia Type: General Level of consciousness: awake and alert Pain management: pain level controlled Vital Signs Assessment: post-procedure vital signs reviewed and stable Respiratory status: spontaneous breathing, nonlabored ventilation, respiratory function stable and patient connected to nasal cannula oxygen Cardiovascular status: blood pressure returned to baseline and stable Postop Assessment: no signs of nausea or vomiting Anesthetic complications: no    Last Vitals:  Vitals:   02/16/16 1245 02/16/16 1300  BP: 103/62 106/62  Pulse: 67 70  Resp: (!) 24 11  Temp:      Last Pain:  Vitals:   02/16/16 1312  TempSrc:   PainSc: 5                  Phillips Groutarignan, Aleysia Oltmann

## 2016-02-16 NOTE — Interval H&P Note (Signed)
History and Physical Interval Note:  02/16/2016 9:41 AM  Christine Jones  has presented today for surgery, with the diagnosis of GRADE 3 HEMMORRHOIDS, EXTERNAL HEMS  The various methods of treatment have been discussed with the patient and family. After consideration of risks, benefits and other options for treatment, the patient has consented to  Procedure(s): EXAM UNDER ANESTHESIA WITH HEMORRHODIAL LIGATION/PEXY (N/A) POSSIBLE HEMMORRHOIDECTOMY (N/A) as a surgical intervention .  The patient's history has been reviewed, patient examined, no change in status, stable for surgery.  I have reviewed the patient's chart and labs.  Questions were answered to the patient's satisfaction.     Daryan Cagley C.

## 2016-02-16 NOTE — Anesthesia Procedure Notes (Signed)
Procedure Name: Intubation Date/Time: 02/16/2016 11:04 AM Performed by: Tyrone NineSAUVE, Karrin Eisenmenger F Pre-anesthesia Checklist: Patient identified, Timeout performed, Suction available, Emergency Drugs available and Patient being monitored Patient Re-evaluated:Patient Re-evaluated prior to inductionOxygen Delivery Method: Circle system utilized Preoxygenation: Pre-oxygenation with 100% oxygen Intubation Type: IV induction Ventilation: Mask ventilation without difficulty Laryngoscope Size: Mac and 3 Grade View: Grade II Tube type: Oral Tube size: 7.0 mm Number of attempts: 1 Airway Equipment and Method: Stylet Placement Confirmation: ETT inserted through vocal cords under direct vision,  breath sounds checked- equal and bilateral and CO2 detector Secured at: 22 cm Tube secured with: Tape Dental Injury: Teeth and Oropharynx as per pre-operative assessment

## 2016-02-16 NOTE — Discharge Instructions (Signed)
ANORECTAL SURGERY:  °POST OPERATIVE INSTRUCTIONS ° °###################################################################### ° °EAT °Gradually transition to a high fiber diet with a fiber supplement over the next few weeks after discharge.  Start with a pureed / full liquid diet (see below) ° °WALK °Walk an hour a day.  Control your pain to do that.   ° °CONTROL PAIN °Control pain so that you can walk, sleep, tolerate sneezing/coughing, go up/down stairs. ° °HAVE A BOWEL MOVEMENT DAILY °Keep your bowels regular to avoid problems.  OK to try a laxative to override constipation.  OK to use an antidairrheal to slow down diarrhea.  Call if not better after 2 tries ° °CALL IF YOU HAVE PROBLEMS/CONCERNS °Call if you are still struggling despite following these instructions. °Call if you have concerns not answered by these instructions ° °###################################################################### ° ° ° °1. Take your usually prescribed home medications unless otherwise directed. °2. DIET: Follow a light bland diet the first 24 hours after arrival home, such as soup, liquids, crackers, etc.  Be sure to include lots of fluids daily.  Avoid fast food or heavy meals as your are more likely to get nauseated.  Eat a low fat the next few days after surgery.   °3. PAIN CONTROL: °a. Pain is best controlled by a usual combination of three different methods TOGETHER: °i. Ice/Heat °ii. Over the counter pain medication °iii. Prescription pain medication °b. Most patients will experience some swelling and discomfort in the anus/rectal area. and incisions.  Ice packs or heat (30-60 minutes up to 6 times a day) will help. Use ice for the first few days to help decrease swelling and bruising, then switch to heat such as warm towels, sitz baths, warm baths, etc to help relax tight/sore spots and speed recovery.  Some people prefer to use ice alone, heat alone, alternating between ice & heat.  Experiment to what works for you.   Swelling and bruising can take several weeks to resolve.   °c. It is helpful to take an over-the-counter pain medication regularly for the first few weeks.  Choose one of the following that works best for you: °i. Naproxen (Aleve, etc)  Two 220mg tabs twice a day °ii. Ibuprofen (Advil, etc) Three 200mg tabs four times a day (every meal & bedtime) °iii. Acetaminophen (Tylenol, etc) 500-650mg four times a day (every meal & bedtime) °d. A  prescription for pain medication (such as oxycodone, hydrocodone, etc) should be given to you upon discharge.  Take your pain medication as prescribed.  °i. If you are having problems/concerns with the prescription medicine (does not control pain, nausea, vomiting, rash, itching, etc), please call us [phone] to see if we need to switch you to a different pain medicine that will work better for you and/or control your side effect better. °ii. If you need a refill on your pain medication, please contact your pharmacy.  They will contact our office to request authorization. Prescriptions will not be filled after 5 pm or on week-ends. ° °Use a Sitz Bath 4-8 times a day for relief ° ° °Sitz Bath °A sitz bath is a warm water bath taken in the sitting position that covers only the hips and buttocks. It may be used for either healing or hygiene purposes. Sitz baths are also used to relieve pain, itching, or muscle spasms. The water may contain medicine. Moist heat will help you heal and relax.  °HOME CARE INSTRUCTIONS  °Take 3 to 4 sitz baths a day. °1. Fill the bathtub   half full with warm water. °2. Sit in the water and open the drain a little. °3. Turn on the warm water to keep the tub half full. Keep the water running constantly. °4. Soak in the water for 15 to 20 minutes. °5. After the sitz bath, pat the affected area dry first. ° ° °4. KEEP YOUR BOWELS REGULAR °a. The goal is one bowel movement a day °b. Avoid getting constipated.  Between the surgery and the pain medications, it  is common to experience some constipation.  Increasing fluid intake and taking a fiber supplement (such as Metamucil, Citrucel, FiberCon, MiraLax, etc) 1-2 times a day regularly will usually help prevent this problem from occurring.  A mild laxative (prune juice, Milk of Magnesia, MiraLax, etc) should be taken according to package directions if there are no bowel movements after 48 hours. °c. Watch out for diarrhea.  If you have many loose bowel movements, simplify your diet to bland foods & liquids for a few days.  Stop any stool softeners and decrease your fiber supplement.  Switching to mild anti-diarrheal medications (Kayopectate, Pepto Bismol) can help.  If this worsens or does not improve, please call us. ° °5. Wound Care ° °a. Remove your bandages the day after surgery.  Unless discharge instructions indicate otherwise, leave your bandage dry and in place overnight.  Remove the bandage during your first bowel movement.   °b. Wear an absorbent pad or soft cotton gauze in your underwear as needed to catch any drainage and help keep the area  °c. Keep the area clean and dry.  Bathe / shower every day.  Keep the area clean by showering / bathing over the incision / wound.   It is okay to soak an open wound to help wash it.  Wet wipes or showers / gentle washing after bowel movements is often less traumatic than regular toilet paper. °d. You will often notice bleeding with bowel movements.  This should slow down by the end of the first week of surgery °e. Expect some drainage.  This should slow down, too, by the end of the first week of surgery.  Wear an absorbent pad or soft cotton gauze in your underwear until the drainage stops. ° °6. ACTIVITIES as tolerated:   °a. You may resume regular (light) daily activities beginning the next day--such as daily self-care, walking, climbing stairs--gradually increasing activities as tolerated.  If you can walk 30 minutes without difficulty, it is safe to try more intense  activity such as jogging, treadmill, bicycling, low-impact aerobics, swimming, etc. °b. Save the most intensive and strenuous activity for last such as sit-ups, heavy lifting, contact sports, etc  Refrain from any heavy lifting or straining until you are off narcotics for pain control.   °c. DO NOT PUSH THROUGH PAIN.  Let pain be your guide: If it hurts to do something, don't do it.  Pain is your body warning you to avoid that activity for another week until the pain goes down. °d. You may drive when you are no longer taking prescription pain medication, you can comfortably sit for long periods of time, and you can safely maneuver your car and apply brakes. °e. You may have sexual intercourse when it is comfortable.  °7. FOLLOW UP in our office °a. Please call [facility] at [phone] to set up an appointment to see your surgeon in the office for a follow-up appointment approximately 2 weeks after your surgery. °b. Make sure that you call for   this appointment the day you arrive home to insure a convenient appointment time. 10. IF YOU HAVE DISABILITY OR FAMILY LEAVE FORMS, BRING THEM TO THE OFFICE FOR PROCESSING.  DO NOT GIVE THEM TO YOUR DOCTOR.        WHEN TO CALL US [phone]: 1. Poor pain control 2. Reactions / problems with new medications (rash/itching, nausea, etc)  3. Fever over 101.5 F (38.5 C) 4. Inability to urinate 5. Nausea and/or vomiting 6. Worsening swelling or bruising 7. Continued bleeding from incision. 8. Increased pain, redness, or drainage from the incision  The clinic staff is available to answer your questions during regular business hours (8:30am-5pm).  Please dont hesitate to call and ask to speak to one of our nurses for clinical concerns.   [facility] is always on call at the hospitals   If you have a medical emergency, go to the nearest emergency room or call 911.    [facility] [address], [address],  [place], [place]  [address] ? MAIN: [phone] ? TOLL FREE: [phone] ? FAX [phone]) [phone] [email or web address]   HEMORRHOIDS  The rectum is the last foot of your colon, and it naturally stretches to hold stool.  Hemorrhoidal piles are natural clusters of blood vessels that help the rectum and anal canal stretch to hold stool and allow bowel movements to eliminate feces.   Hemorrhoids are abnormally swollen blood vessels in the rectum.  Too much pressure in the rectum causes hemorrhoids by forcing blood to stretch and bulge the walls of the veins, sometimes even rupturing them.  Hemorrhoids can become like varicose veins you might see on a person's legs.  Most people will develop a flare of hemorrhoids in their lifetime.  When bulging hemorrhoidal veins are irritated, they can swell, burn, itch, cause pain, and bleed.  Most flares will calm down gradually own within a few weeks.  However, once hemorrhoids are created, they are difficult to get rid of completely and tend to flare more easily than the first flare.   Fortunately, good habits and simple medical treatment usually control hemorrhoids well, and surgery is needed only in severe cases. Types of Hemorrhoids:  Internal hemorrhoids usually don't initially hurt or itch; they are deep inside the rectum and usually have no sensation. If they begin to push out (prolapse), pain and burning can occur.  However, internal hemorrhoids can bleed.  Anal bleeding should not be ignored since bleeding could come from a dangerous source like colorectal cancer, so persistent rectal bleeding should be investigated by a doctor, sometimes with a colonoscopy.  External hemorrhoids cause most of the symptoms - pain, burning, and itching. Nonirritated hemorrhoids can look like small skin tags coming out of the anus.   Thrombosed hemorrhoids can form when a hemorrhoid blood vessel bursts and causes the hemorrhoid to suddenly swell.  A purple blood clot can  form in it and become an excruciatingly painful lump at the anus. Because of these unpleasant symptoms, immediate incision and drainage by a surgeon at an office visit can provide much relief of the pain.    PREVENTION Avoiding the most frequent causes listed below will prevent most cases of hemorrhoids: Constipation Hard stools Diarrhea  Constant sitting  Straining with bowel movements Sitting on the toilet for a long time  Severe coughing  episodes Pregnancy / Childbirth  Heavy Lifting  Sometimes avoiding the above triggers is difficult:  How can you avoid sitting all  day if you have a seated job? Also, we try to avoid coughing and diarrhea, but sometimes its beyond your control.  Still, there are some practical hints to help: Keep the anal and genital area clean.  Moistened tissues such as flushable wet wipes are less irritating than toilet paper.  Using irrigating showers or bottle irrigation washing gently cleans this sensitive area.   Avoid dry toilet paper when cleaning after bowel movements.  Marland Kitchen. Keep the anal and genital area dry.  Lightly pat the rectal area dry.  Avoid rubbing.  Talcum or baby powders can help GET YOUR STOOLS SOFT.   This is the most important way to prevent irritated hemorrhoids.  Hard stools are like sandpaper to the anorectal canal and will cause more problems.  The goal: ONE SOFT BOWEL MOVEMENT A DAY!  BMs from every other day to 3 times a day is a tolerable range Treat coughing, diarrhea and constipation early since irritated hemorrhoids may soon follow.  If your main job activity is seated, always stand or walk during your breaks. Make it a point to stand and walk at least 5 minutes every hour and try to shift frequently in your chair to avoid direct rectal pressure.  Always exhale as you strain or lift. Don't hold your breath.  Do not delay or try to prevent a bowel movement when the urge is present. Exercise regularly (walking or jogging 60 minutes a day) to  stimulate the bowels to move. No reading or other activity while on the toilet. If bowel movements take longer than 5 minutes, you are too constipated. AVOID CONSTIPATION Drink plenty of liquids (1 1/2 to 2 quarts of water and other fluids a day unless fluid restricted for another medical condition). Liquids that contain caffeine (coffee a, tea, soft drinks) can be dehydrating and should be avoided until constipation is controlled. Consider minimizing milk, as dairy products may be constipating. Eat plenty of fiber (30g a day ideal, more if needed).  Fiber is the undigested part of plant food that passes into the colon, acting as natures broom to encourage bowel motility and movement.  Fiber can absorb and hold large amounts of water. This results in a larger, bulkier stool, which is soft and easier to pass.  Eating foods high in fiber - 12 servings - such as  Vegetables: Root (potatoes, carrots, turnips), Leafy green (lettuce, salad greens, celery, spinach), High residue (cabbage, broccoli, etc.) Fruit: Fresh, Dried (prunes, apricots, cherries), Stewed (applesauce)  Whole grain breads, pasta, whole wheat Bran cereals, muffins, etc. Consider adding supplemental bulking fiber which retains large volumes of water: Psyllium ground seeds (native plant from [place])--available as Metamucil, Konsyl, Effersyllium, Per Diem Fiber, or the less expensive generic forms.  Citrucel  (methylcellulose wood fiber) . FiberCon (Polycarbophil) Polyethylene Glycol - and artificial fiber commonly called Miralax or Glycolax.  It is helpful for people with gassy or bloated feelings with regular fiber Flax Seed - a less gassy natural fiber  Laxatives can be useful for a short period if constipation is severe Osmotics (Milk of Magnesia, Fleets Phospho-Soda, Magnesium Citrate)  Stimulants (Senokot,   Castor Oil,  Dulcolax, Ex-Lax)    Laxatives are not a good long-term solution as it can stress the bowels and cause  too much mineral loss and dehydration.   Avoid taking laxatives for more than 7 days in a row.  AVOID DIARRHEA Switch to liquids and simpler foods for a few days to avoid stressing your intestines further. Avoid  dairy products (especially milk & ice cream) for a short time.  The intestines often can lose the ability to digest lactose when stressed. Avoid foods that cause gassiness or bloating.  Typical foods include beans and other legumes, cabbage, broccoli, and dairy foods.  Every person has some sensitivity to other foods, so listen to your body and avoid those foods that trigger problems for you. Adding fiber (Citrucel, Metamucil, FiberCon, Flax seed, Miralax) gradually can help thicken stools by absorbing excess fluid and retrain the intestines to act more normally.  Slowly increase the dose over a few weeks.  Too much fiber too soon can backfire and cause cramping & bloating. Probiotics (such as active yogurt, Align, etc) may help repopulate the intestines and colon with normal bacteria and calm down a sensitive digestive tract.  Most studies show it to be of mild help, though, and such products can be costly. Medicines: Bismuth subsalicylate (ex. Kayopectate, Pepto Bismol) every 30 minutes for up to 6 doses can help control diarrhea.  Avoid if pregnant. Loperamide (Immodium) can slow down diarrhea.  Start with two tablets (4mg  total) first and then try one tablet every 6 hours.  Avoid if you are having fevers or severe pain.  If you are not better or start feeling worse, stop all medicines and call your doctor for advice Call your doctor if you are getting worse or not better.  Sometimes further testing (cultures, endoscopy, X-ray studies, bloodwork, etc) may be needed to help diagnose and treat the cause of the diarrhea.  TROUBLESHOOTING IRREGULAR BOWELS 1) Avoid extremes of bowel movements (no bad constipation/diarrhea) 2) Miralax 17gm mixed in 8oz. water or juice-daily. May use BID as  needed.  3) Gas-x,Phazyme, etc. as needed for gas & bloating.  4) Soft,bland diet. No spicy,greasy,fried foods.  5) Prilosec over-the-counter as needed  6) May hold gluten/wheat products from diet to see if symptoms improve.  7)  May try probiotics (Align, Activa, etc) to help calm the bowels down 7) If symptoms become worse call back immediately.   TREATMENT OF HEMORRHOID FLARE If these preventive measures fail, you must take action right away! Hemorrhoids are one condition that can be mild in the morning and become intolerable by nightfall. Most hemorrhoidal flares take several weeks to calm down.  These suggestions can help: Warm soaks.  This helps more than any topical medication.  Use up to 8 times a day.  Usually sitz baths or sitting in a warm bathtub helps.  Sitting on moist warm towels are helpful.  Switching to ice packs/cool compresses can be helpful  Use a Sitz Bath 4-8 times a day for relief A sitz bath is a warm water bath taken in the sitting position that covers only the hips and buttocks. It may be used for either healing or hygiene purposes. Sitz baths are also used to relieve pain, itching, or muscle spasms. The water may contain medicine. Moist heat will help you heal and relax.  HOME CARE INSTRUCTIONS  Take 3 to 4 sitz baths a day. 6. Fill the bathtub half full with warm water. 7. Sit in the water and open the drain a little. 8. Turn on the warm water to keep the tub half full. Keep the water running constantly. 9. Soak in the water for 15 to 20 minutes. 10. After the sitz bath, pat the affected area dry first. SEEK MEDICAL CARE IF:  You get worse instead of better. Stop the sitz baths if you get worse.  Normalize your bowels.  Extremes of diarrhea or constipation will make hemorrhoids worse.  One soft bowel movement a day is the goal.  Fiber can help get your bowels regular Wet wipes instead of toilet paper Pain control with a NSAID such as ibuprofen (Advil) or  naproxen (Aleve) or acetaminophen (Tylenol) around the clock.  Narcotics are constipating and should be minimized if possible Topical creams contain steroids (bydrocortisone) or local anesthetic (xylocaine) can help make pain and itching more tolerable.   EVALUATION If hemorrhoids are still causing problems, you could benefit by an evaluation by a surgeon.  The surgeon will obtain a history and examine you.  If hemorrhoids are diagnosed, some therapies can be offered in the office, usually with an anoscope into the less sensitive area of the rectum: -injection of hemorrhoids (sclerotherapy) can scar the blood vessels of the swollen/enlarged hemorrhoids to help shrink them down to a more normal size -rubber banding of the enlarged hemorrhoids to help shrink them down to a more normal size -drainage of the blood clot causing a thrombosed hemorrhoid,  to relieve the severe pain   While 90% of the time such problems from hemorrhoids can be managed without preceding to surgery, sometimes the hemorrhoids require a operation to control the problem (uncontrolled bleeding, prolapse, pain, etc.).   This involves being placed under general anesthesia where the surgeon can confirm the diagnosis and remove, suture, or staple the hemorrhoid(s).  Your surgeon can help you treat the problem appropriately.     Post Anesthesia Home Care Instructions  Activity: Get plenty of rest for the remainder of the day. A responsible adult should stay with you for 24 hours following the procedure.  For the next 24 hours, DO NOT: -Drive a car -Advertising copywriter -Drink alcoholic beverages -Take any medication unless instructed by your physician -Make any legal decisions or sign important papers.  Meals: Start with liquid foods such as gelatin or soup. Progress to regular foods as tolerated. Avoid greasy, spicy, heavy foods. If nausea and/or vomiting occur, drink only clear liquids until the nausea and/or vomiting subsides.  Call your physician if vomiting continues.  Special Instructions/Symptoms: Your throat may feel dry or sore from the anesthesia or the breathing tube placed in your throat during surgery. If this causes discomfort, gargle with warm salt water. The discomfort should disappear within 24 hours.  If you had a scopolamine patch placed behind your ear for the management of post- operative nausea and/or vomiting:  1. The medication in the patch is effective for 72 hours, after which it should be removed.  Wrap patch in a tissue and discard in the trash. Wash hands thoroughly with soap and water. 2. You may remove the patch earlier than 72 hours if you experience unpleasant side effects which may include dry mouth, dizziness or visual disturbances. 3. Avoid touching the patch. Wash your hands with soap and water after contact with the patch.

## 2016-02-16 NOTE — Op Note (Signed)
02/16/2016  12:13 PM  PATIENT:  Christine Jones  55 y.o. female  Patient Care Team: Renaye RakersVeita Bland, MD as PCP - General (Family Medicine) Karie SodaSteven Ladarryl Wrage, MD as Consulting Physician (General Surgery)  PRE-OPERATIVE DIAGNOSIS:  GRADE 3 HEMMORRHOIDS, EXTERNAL HEMS  POST-OPERATIVE DIAGNOSIS:  GRADE 34 HEMMORRHOIDS, EXTERNAL HEMS  PROCEDURE:  : EXAM UNDER ANESTHESIA HEMORRHOIDIL LIGATION/PEXY HEMMORRHOIDECTOMY  SURGEON:  Surgeon(s): Karie SodaSteven Meshulem Onorato, MD  ASSISTANT: none   ANESTHESIA:   Local field block 0.25% bupivacaine with epinephrine & liposomal bupivacaine (Experel) at the beginning & end of the case. Anorectal block General   EBL:  Total I/O In: -  Out: 25 [Blood:25]  Delay start of Pharmacological VTE agent (>24hrs) due to surgical blood loss or risk of bleeding:  no  DRAINS: none   SPECIMEN:  Source of Specimen:  Hemorrhoids (Left lateral & right anterior)  DISPOSITION OF SPECIMEN:  PATHOLOGY  COUNTS:  YES  PLAN OF CARE: Discharge to home after PACU  PATIENT DISPOSITION:  PACU - hemodynamically stable.  INDICATION: Patient with increasingly symptomatic grade 3 and four internal hemorrhoids refractory to nonoperative management.  I felt she would benefit from hemorrhoidal ligation and pexied removal of any remaining hemorrhoidal tissue.  The anatomy & physiology of the anorectal region was discussed.  The pathophysiology of hemorrhoids and differential diagnosis was discussed.  Natural history risks without surgery was discussed.   I stressed the importance of a bowel regimen to have daily soft bowel movements to minimize progression of disease.  Interventions such as sclerotherapy & banding were discussed.  The patient's symptoms are not adequately controlled by medicines and other non-operative treatments.  I feel the risks & problems of no surgery outweigh the operative risks; therefore, I recommended surgery to treat the hemorrhoids by ligation, pexy, and possible  resection.  Risks such as bleeding, infection, urinary difficulties, need for further treatment, heart attack, death, and other risks were discussed.   I noted a good likelihood this will help address the problem.  Goals of post-operative recovery were discussed as well.  Possibility that this will not correct all symptoms was explained.  Post-operative pain, bleeding, constipation, and other problems after surgery were discussed.  We will work to minimize complications.   Educational handouts further explaining the pathology, treatment options, and bowel regimen were given as well.  Questions were answered.  The patient expresses understanding & wishes to proceed with surgery.  OR FINDINGS: Large grade 3 / 4 internal hemorrhoids.  Left lateral greater than right anterior greater than right posterior.  DESCRIPTION:   Informed consent was confirmed. Patient underwent general anesthesia without difficulty. Patient was placed into prone positioning.  The perianal region was prepped and draped in sterile fashion. Surgical time-out confirmed our plan.  I did digital rectal examination and then transitioned over to anoscopy to get a sense of the anatomy.  I proceeded to ligate the hemorrhoidal arteries. I used a 2-0 Vicryl suture on a UR-6 needle in a figure-of-eight fashion around 5-6 cm proximal to the anal verge. I then ran that stitch longitudinally more distally to the white line of [place], occasionally locking suture or tying as I went down.  This help pexy the redundant hemorrhoidal and rectal tissue more proximally in help reduce the hemorrhoids partially.  I did that for all 6 locations (right anterior/lateral/posterior, left anterior/lateral/posterior.  Hexagonal pattern).     As I did these ligation pexy sutures, I did have to excise some inflamed and redundant hemorrhoidal tissue.  I did  have to excise a persistently large left lateral hemorrhoid internally to externally.  I did excise some smaller  persistent tissue in the right anterior and right posterior axillary in the external region.  Those wounds were closed down using 2-0 chromic suture radially/longitudinally.   At completion of this, all hemorrhoids were reduced into the rectum.  There was no more prolapse. External anatomy looked much improved and more normal.  I repeated anoscopy and examination.  Hemostasis was good.  Patient is being extubated go to recovery room.  I had discussed postop care in detail with the patient in the preop holding area.  Instructions are written.  I am about to discuss the patient's status to her friend as well.     Ardeth SportsmanSteven C. Batu Cassin, M.D., F.A.C.S. Gastrointestinal and Minimally Invasive Surgery [facility] [address] [place], [place] [id number] [phone] Main / Paging

## 2016-02-17 ENCOUNTER — Encounter (HOSPITAL_BASED_OUTPATIENT_CLINIC_OR_DEPARTMENT_OTHER): Payer: Self-pay | Admitting: Surgery

## 2016-02-21 MED FILL — oxyCODONE HCL 5 MG TABS: 5 | 4 days supply | Qty: 40 | Fill #0

## 2016-08-09 ENCOUNTER — Other Ambulatory Visit: Payer: Self-pay | Admitting: Family Medicine

## 2016-08-09 ENCOUNTER — Ambulatory Visit
Admission: RE | Admit: 2016-08-09 | Discharge: 2016-08-09 | Disposition: A | Discharge status: Home / Self Care | Payer: BLUE CROSS/BLUE SHIELD | Source: Ambulatory Visit | Attending: Family Medicine | Admitting: Family Medicine

## 2016-08-09 DIAGNOSIS — R0689 Other abnormalities of breathing: Secondary | ICD-10-CM

## 2016-11-20 ENCOUNTER — Other Ambulatory Visit: Payer: Self-pay | Admitting: Family Medicine

## 2016-11-20 ENCOUNTER — Ambulatory Visit
Admission: RE | Admit: 2016-11-20 | Discharge: 2016-11-20 | Disposition: A | Discharge status: Home / Self Care | Payer: BLUE CROSS/BLUE SHIELD | Source: Ambulatory Visit | Attending: Family Medicine | Admitting: Family Medicine

## 2016-11-20 DIAGNOSIS — R609 Edema, unspecified: Secondary | ICD-10-CM

## 2019-01-15 ENCOUNTER — Other Ambulatory Visit: Payer: Self-pay

## 2019-01-15 DIAGNOSIS — Z20822 Contact with and (suspected) exposure to covid-19: Secondary | ICD-10-CM

## 2019-01-17 LAB — NOVEL CORONAVIRUS, NAA: SARS-CoV-2, NAA: NOT DETECTED

## 2019-03-26 ENCOUNTER — Other Ambulatory Visit: Payer: Self-pay | Admitting: Cardiology

## 2019-03-26 DIAGNOSIS — Z20822 Contact with and (suspected) exposure to covid-19: Secondary | ICD-10-CM

## 2019-03-27 LAB — NOVEL CORONAVIRUS, NAA: SARS-CoV-2, NAA: NOT DETECTED

## 2019-08-17 ENCOUNTER — Other Ambulatory Visit: Payer: Self-pay | Admitting: Family Medicine

## 2019-08-17 DIAGNOSIS — Z1231 Encounter for screening mammogram for malignant neoplasm of breast: Secondary | ICD-10-CM

## 2019-08-17 DIAGNOSIS — Z13820 Encounter for screening for osteoporosis: Secondary | ICD-10-CM

## 2019-11-25 ENCOUNTER — Other Ambulatory Visit: Payer: BLUE CROSS/BLUE SHIELD

## 2019-12-23 ENCOUNTER — Ambulatory Visit
Admission: RE | Admit: 2019-12-23 | Discharge: 2019-12-23 | Disposition: A | Discharge status: Home / Self Care | Payer: 59 | Source: Ambulatory Visit | Attending: Family Medicine | Admitting: Family Medicine

## 2019-12-23 ENCOUNTER — Other Ambulatory Visit: Payer: Self-pay

## 2019-12-23 DIAGNOSIS — Z1231 Encounter for screening mammogram for malignant neoplasm of breast: Secondary | ICD-10-CM

## 2019-12-23 DIAGNOSIS — Z13820 Encounter for screening for osteoporosis: Secondary | ICD-10-CM

## 2020-10-27 ENCOUNTER — Other Ambulatory Visit: Payer: Self-pay | Admitting: Family Medicine

## 2020-10-27 DIAGNOSIS — Z1231 Encounter for screening mammogram for malignant neoplasm of breast: Secondary | ICD-10-CM

## 2020-12-29 ENCOUNTER — Other Ambulatory Visit: Payer: Self-pay

## 2020-12-29 ENCOUNTER — Ambulatory Visit
Admission: RE | Admit: 2020-12-29 | Discharge: 2020-12-29 | Disposition: A | Discharge status: Home / Self Care | Payer: 59 | Source: Ambulatory Visit | Attending: Family Medicine | Admitting: Family Medicine

## 2020-12-29 DIAGNOSIS — Z1231 Encounter for screening mammogram for malignant neoplasm of breast: Secondary | ICD-10-CM

## 2021-10-09 IMAGING — MG MM DIGITAL SCREENING BILAT W/ TOMO AND CAD
8 series · 8 of 24 positions shown · non-contrast
Comparison: Previous exam(s).

CLINICAL DATA: Screening.

EXAM:
DIGITAL SCREENING BILATERAL MAMMOGRAM WITH TOMOSYNTHESIS AND CAD
TECHNIQUE: Bilateral screening digital craniocaudal and mediolateral oblique
mammograms were obtained. Bilateral screening digital breast
tomosynthesis was performed. The images were evaluated with
computer-aided detection.

[R CC synth-2D]
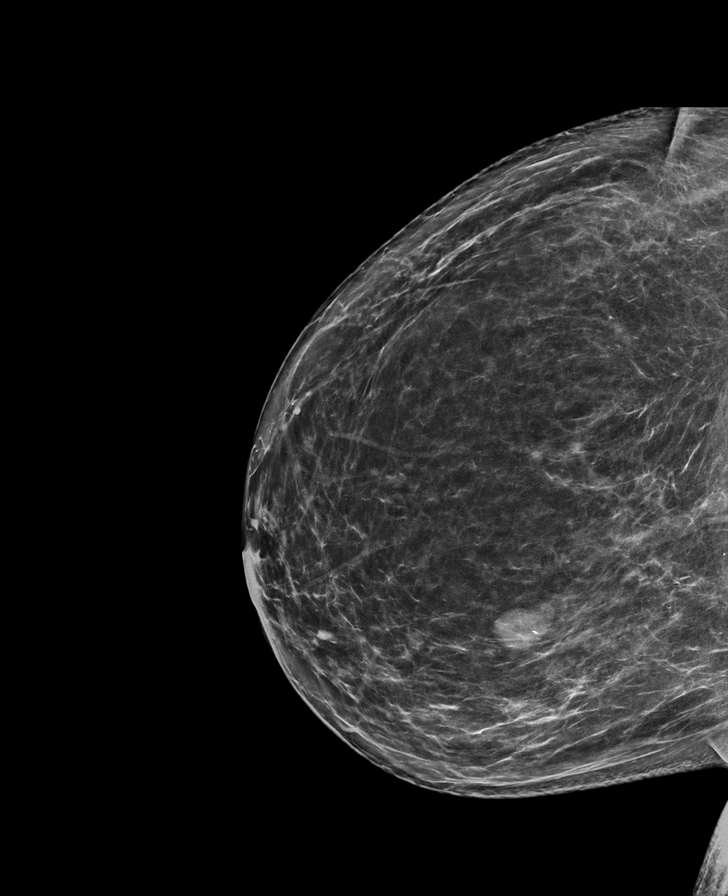

[L CC synth-2D]
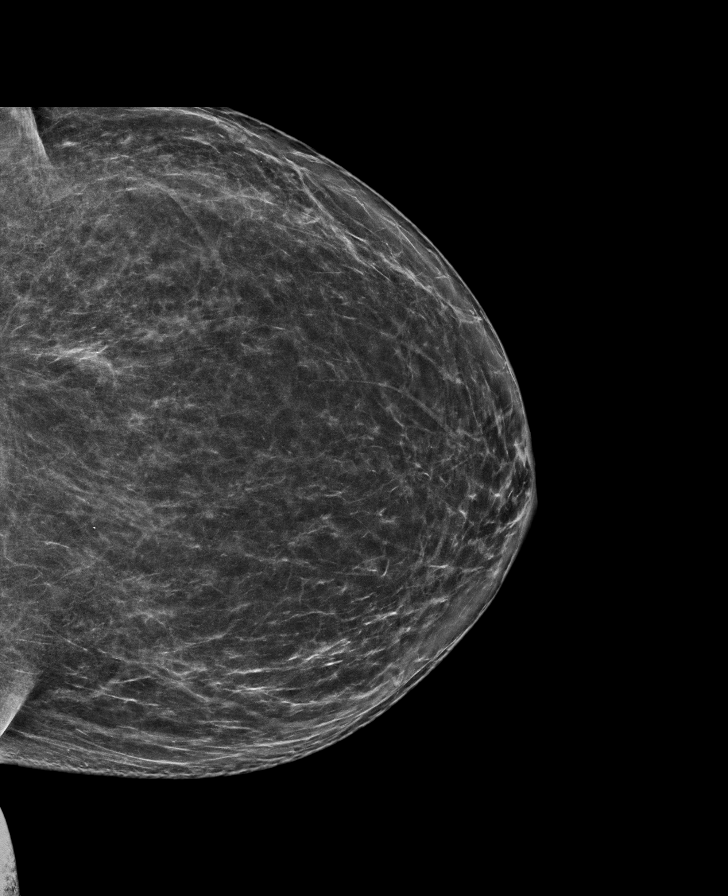

[L MLO synth-2D]
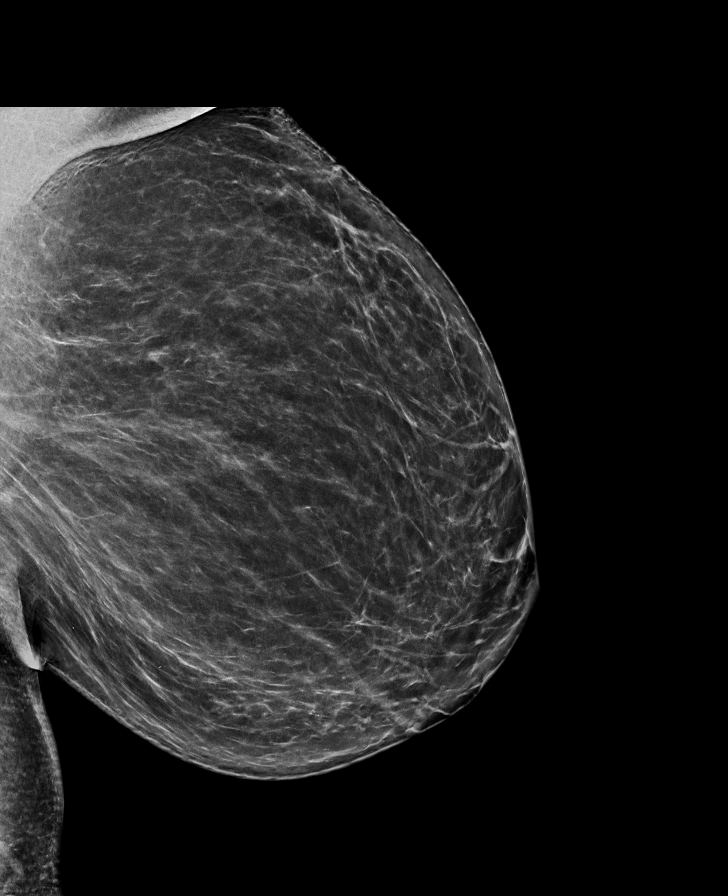

[R MLO synth-2D]
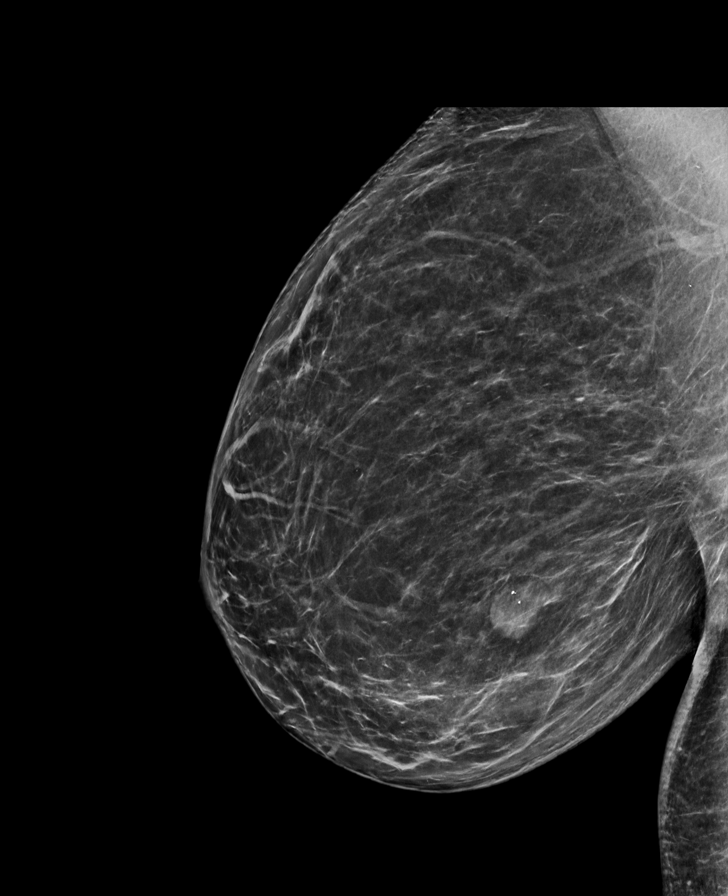

[L MLO tomo · tomo slice 43/86.0]
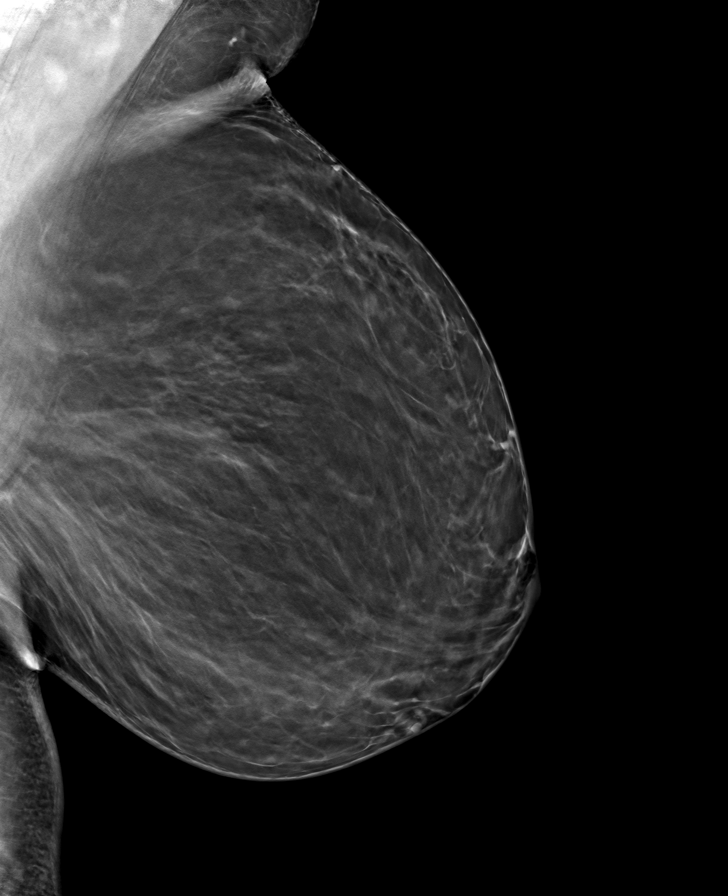

[L CC tomo · tomo slice 39/77.0]
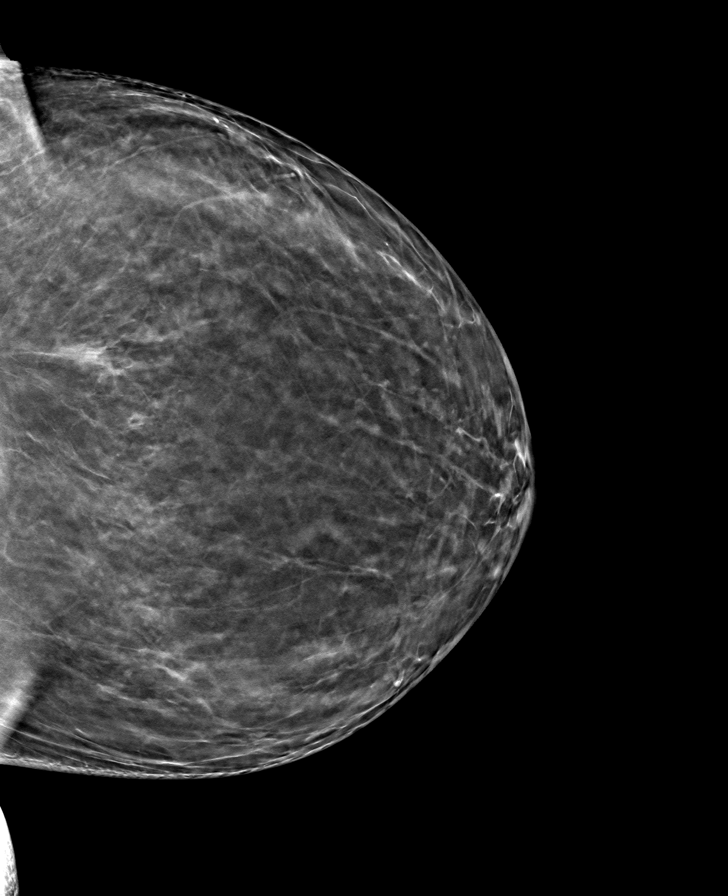

[R MLO tomo · tomo slice 43/86.0]
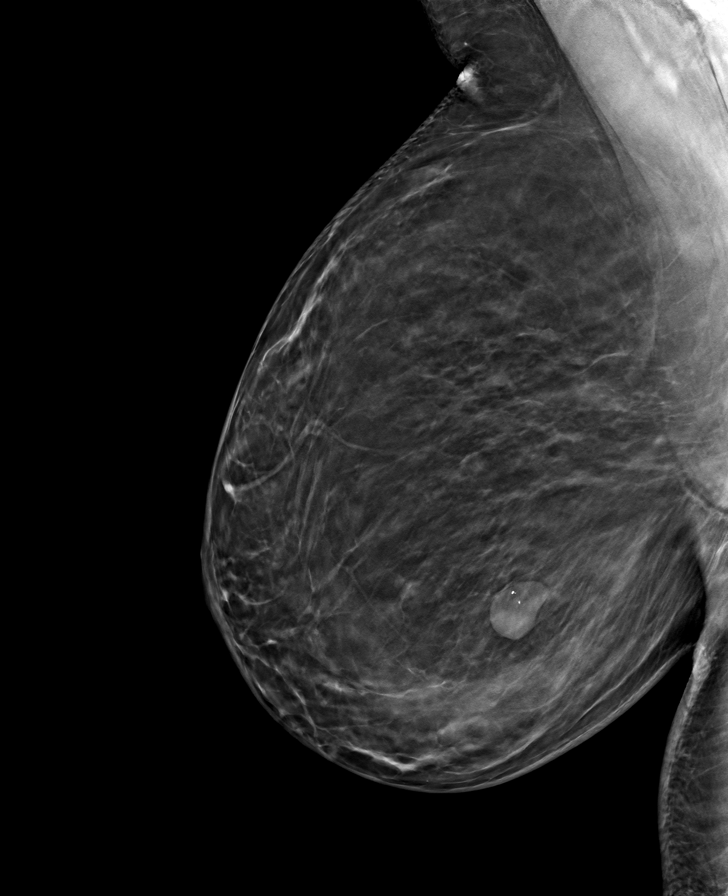

[R CC tomo · tomo slice 41/81.0]
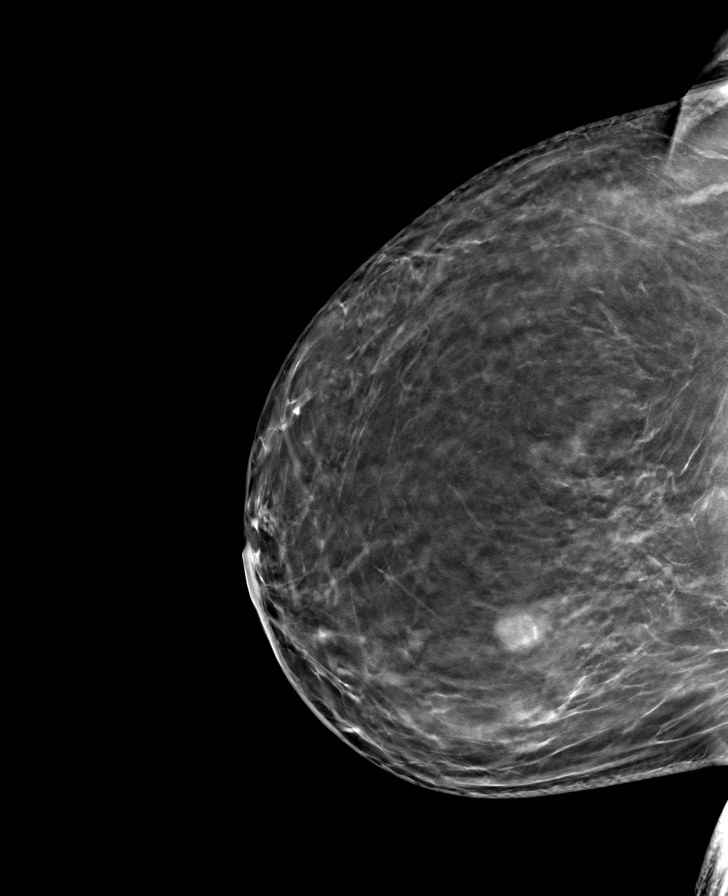

[8 of 24 positions shown; findings below may reference images not displayed]

ACR Breast Density Category b: There are scattered areas of
fibroglandular density.
FINDINGS: There are no findings suspicious for malignancy.
IMPRESSION: No mammographic evidence of malignancy. A result letter of this
screening mammogram will be mailed directly to the patient.

RECOMMENDATION:
Screening mammogram in one year. (Code:51-O-LD2)

BI-RADS CATEGORY  1: Negative.
